# Patient Record
Sex: Female | Born: 1950 | Race: White | Hispanic: No | Marital: Married
Health system: Southern US, Community
[De-identification: ages and names within clinical notes are randomized; demographics above are authoritative.]

## PROBLEM LIST (undated history)

## (undated) DIAGNOSIS — C801 Malignant (primary) neoplasm, unspecified: Secondary | ICD-10-CM

---

## 2020-07-14 ENCOUNTER — Encounter (HOSPITAL_BASED_OUTPATIENT_CLINIC_OR_DEPARTMENT_OTHER): Payer: Self-pay | Admitting: Emergency Medicine

## 2020-07-14 ENCOUNTER — Other Ambulatory Visit: Payer: Self-pay

## 2020-07-14 ENCOUNTER — Emergency Department (HOSPITAL_BASED_OUTPATIENT_CLINIC_OR_DEPARTMENT_OTHER)
Admission: EM | Admit: 2020-07-14 | Discharge: 2020-07-14 | Disposition: A | Discharge status: Home / Self Care | Payer: Medicare HMO | Attending: Emergency Medicine | Admitting: Emergency Medicine

## 2020-07-14 ENCOUNTER — Emergency Department (HOSPITAL_BASED_OUTPATIENT_CLINIC_OR_DEPARTMENT_OTHER): Payer: Medicare HMO

## 2020-07-14 DIAGNOSIS — S43004A Unspecified dislocation of right shoulder joint, initial encounter: Secondary | ICD-10-CM | POA: Insufficient documentation

## 2020-07-14 DIAGNOSIS — S4991XA Unspecified injury of right shoulder and upper arm, initial encounter: Secondary | ICD-10-CM | POA: Diagnosis present

## 2020-07-14 DIAGNOSIS — W1830XA Fall on same level, unspecified, initial encounter: Secondary | ICD-10-CM | POA: Insufficient documentation

## 2020-07-14 DIAGNOSIS — Z853 Personal history of malignant neoplasm of breast: Secondary | ICD-10-CM | POA: Insufficient documentation

## 2020-07-14 HISTORY — DX: Malignant (primary) neoplasm, unspecified: C80.1

## 2020-07-14 MED ORDER — PROPOFOL 10 MG/ML IV BOLUS
INTRAVENOUS | Status: AC | PRN
  Administered 2020-07-14: 39.7 mg via INTRAVENOUS

## 2020-07-14 MED ORDER — FENTANYL CITRATE (PF) 100 MCG/2ML IJ SOLN
100.0000 ug | Freq: Once | INTRAMUSCULAR | Status: AC
Start: 2020-07-14 — End: 2020-07-14
  Administered 2020-07-14: 100 ug via INTRAVENOUS
  Filled 2020-07-14: qty 2

## 2020-07-14 MED ORDER — ONDANSETRON HCL 4 MG/2ML IJ SOLN
4.0000 mg | Freq: Once | INTRAMUSCULAR | Status: AC
  Administered 2020-07-14: 4 mg via INTRAVENOUS
  Filled 2020-07-14: qty 2

## 2020-07-14 MED ORDER — PROPOFOL 10 MG/ML IV BOLUS
0.5000 mg/kg | Freq: Once | INTRAVENOUS | Status: DC
  Filled 2020-07-14: qty 20

## 2020-07-14 NOTE — ED Provider Notes (Signed)
[place] EMERGENCY DEPARTMENT Provider Note   CSN: [id number] Arrival date & time: 07/14/20  1725     History Chief Complaint  Patient presents with   Shoulder Pain    Debbie Meyer is a 70 y.o. female.   Shoulder Pain Associated symptoms: no fever   Patient presents after fall earlier today.  Seen at urgent care had right anterior shoulder dislocation.  No other injury.  Not on anticoagulation.  Sent here for reduction.  Did not hit head.  Has a sling.  States the pain is severe.  States she does not have problems with surgeries in the past.  No chest pain.  No abdominal pain.  No numbness or weakness.    Past Medical History:  Diagnosis Date   Cancer [facility])    breast cancer    There are no problems to display for this patient.   History reviewed. No pertinent surgical history.   OB History   No obstetric history on file.     History reviewed. No pertinent family history.     Home Medications Prior to Admission medications   Not on File    Allergies    Hydrocodone-acetaminophen  Review of Systems   Review of Systems  Constitutional:  Negative for fever.  Cardiovascular:  Negative for chest pain.  Gastrointestinal:  Negative for abdominal pain.  Musculoskeletal:        Right shoulder pain  Neurological:  Negative for weakness.  Psychiatric/Behavioral:  Negative for confusion.    Physical Exam Updated Vital Signs BP (!) 172/80   Pulse 63   Temp 97.8 F (36.6 C)   Resp 17   Ht 5\' 2"  (1.575 m)   Wt 79.4 kg   SpO2 97%   BMI 32.01 kg/m   Physical Exam Vitals reviewed.  Constitutional:      Appearance: Normal appearance.  HENT:     Head: Atraumatic.  Eyes:     Pupils: Pupils are equal, round, and reactive to light.  Chest:     Chest wall: No tenderness.  Abdominal:     Tenderness: There is no abdominal tenderness.  Musculoskeletal:     Cervical back: Neck supple.     Comments: Tenderness to right shoulder with anterior  fullness.  Neurovascular intact in right hand.  No elbow tenderness.  Strong radial pulse.  Skin intact.  No other extremity tenderness.  Left upper extremity restricted due to previous breast cancer  Skin:    General: Skin is warm.  Neurological:     Mental Status: She is alert and oriented to person, place, and time.    ED Results / Procedures / Treatments   Labs (all labs ordered are listed, but only abnormal results are displayed) Labs Reviewed - No data to display  EKG None  Radiology DG Shoulder Right  Result Date: 07/14/2020 CLINICAL DATA:  Postreduction right shoulder. EXAM: RIGHT SHOULDER - 2+ VIEW COMPARISON:  07/14/2020 FINDINGS: Nonstandard positioning limits examination. The right shoulder appears to be in anatomic position consistent with successful reduction of previously noted right shoulder dislocation. No fractures are identified. Degenerative changes in the glenohumeral and acromioclavicular joints. Soft tissues are unremarkable. IMPRESSION: Anatomic position of the right shoulder postreduction. Electronically Signed   By: Lucienne Capers M.D.   On: 07/14/2020 19:26    Procedures .Ortho Injury Treatment  Date/Time: 07/14/2020 6:58 PM Performed by: Davonna Belling, MD Authorized by: Davonna Belling, MD   Consent:    Consent obtained:  Written  Consent given by:  Patient   Risks discussed:  Fracture, nerve damage, restricted joint movement, vascular damage, irreducible dislocation, recurrent dislocation and stiffness   Alternatives discussed:  No treatment and alternative treatment Universal protocol:    Procedure explained and questions answered to patient or proxy's satisfaction: yes     Relevant documents present and verified: yes     Required blood products, implants, devices, and special equipment available: yes     Immediately prior to procedure a time out was called: yes     Patient identity confirmed:  Verbally with patient and arm bandInjury location:  shoulder Location details: right shoulder Injury type: dislocation Dislocation type: anterior Hill-Sachs deformity: no Chronicity: new Pre-procedure neurovascular assessment: neurovascularly intact Pre-procedure distal perfusion: normal Pre-procedure neurological function: normal Pre-procedure range of motion: reduced  Anesthesia: Local anesthesia used: no  Patient sedated: Yes. Refer to sedation procedure documentation for details of sedation. Manipulation performed: yes Reduction method: traction and counter traction Reduction successful: yes X-ray confirmed reduction: yes Immobilization: sling Post-procedure neurovascular assessment: post-procedure neurovascularly intact Post-procedure distal perfusion: normal Post-procedure neurological function: normal Post-procedure range of motion: improved   .Sedation  Date/Time: 07/14/2020 7:00 PM Performed by: Davonna Belling, MD Authorized by: Davonna Belling, MD   Consent:    Consent obtained:  Written   Consent given by:  Patient   Risks discussed:  Allergic reaction, dysrhythmia, nausea, prolonged hypoxia resulting in organ damage, respiratory compromise necessitating ventilatory assistance and intubation, vomiting and inadequate sedation   Alternatives discussed:  Analgesia without sedation Universal protocol:    Procedure explained and questions answered to patient or proxy's satisfaction: yes     Relevant documents present and verified: yes     Immediately prior to procedure, a time out was called: yes     Patient identity confirmed:  Arm band and verbally with patient Indications:    Procedure performed:  Dislocation reduction   Procedure necessitating sedation performed by:  Physician performing sedation Pre-sedation assessment:    Time since last food or drink:  6 hrs   ASA classification: class 2 - patient with mild systemic disease     Mouth opening:  3 or more finger widths   Thyromental distance:  3 finger  widths   Mallampati score:  II - soft palate, uvula, fauces visible   Neck mobility: normal     Pre-sedation assessments completed and reviewed: airway patency, cardiovascular function, hydration status, mental status, pain level and respiratory function     Pre-sedation assessments completed and reviewed: pre-procedure nausea and vomiting status not reviewed   Immediate pre-procedure details:    Reassessment: Patient reassessed immediately prior to procedure     Reviewed: vital signs     Verified: bag valve mask available, emergency equipment available, intubation equipment available, IV patency confirmed, oxygen available and suction available   Procedure details (see MAR for exact dosages):    Preoxygenation:  Nasal cannula   Sedation:  Propofol   Intended level of sedation: deep   Analgesia:  Fentanyl   Intra-procedure monitoring:  Blood pressure monitoring, continuous capnometry, frequent LOC assessments, cardiac monitor and continuous pulse oximetry   Intra-procedure events: none     Total Provider sedation time (minutes):  10 Post-procedure details:    Attendance: Constant attendance by certified staff until patient recovered     Recovery: Patient returned to pre-procedure baseline     Post-sedation assessments completed and reviewed: airway patency, cardiovascular function, hydration status, mental status, nausea/vomiting, pain level, respiratory function and temperature  Patient is stable for discharge or admission: yes     Procedure completion:  Tolerated well, no immediate complications   Medications Ordered in ED Medications  propofol (DIPRIVAN) 10 mg/mL bolus/IV push 39.7 mg (has no administration in time range)  fentaNYL (SUBLIMAZE) injection 100 mcg (100 mcg Intravenous Given 07/14/20 1817)  propofol (DIPRIVAN) 10 mg/mL bolus/IV push (39.7 mg Intravenous Given 07/14/20 1901)  ondansetron (ZOFRAN) injection 4 mg (4 mg Intravenous Given by Other 07/14/20 1931)    ED Course   I have reviewed the triage vital signs and the nursing notes.  Pertinent labs & imaging results that were available during my care of the patient were reviewed by me and considered in my medical decision making (see chart for details).    MDM Rules/Calculators/A&P                          Patient with shoulder dislocation.  Sent in from urgent care.  Reviewed x-ray report from prehospital x-ray.  Reduced under sedation.  X-ray confirmed reduction.  Feels better after the procedure.  Discharge home.  Outpatient follow-up with orthopedic surgery.  Information given for our orthopedic surgeon but has seen a different orthopedic surgeon in the past Final Clinical Impression(s) / ED Diagnoses Final diagnoses:  Shoulder dislocation, right, initial encounter    Rx / DC Orders ED Discharge Orders     None        Davonna Belling, MD 07/14/20 2350

## 2020-07-14 NOTE — ED Triage Notes (Signed)
Patient reports right shoulder dislocation. States seen at urgent care on [address] for right shoulder and sent for shoulder reduction.

## 2020-07-14 NOTE — ED Notes (Signed)
Spoke with pt about IV placement. 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 Looked for IV on right hand which is side that pt had dislocation on. Pt and husband agree to have IV placed on left side out of way of affected shoulder. Pt tolerated IV placement well. No BP placed on left side. Placed IV on thigh at this time. Pt tolerated well.

## 2020-07-15 NOTE — Sedation Documentation (Signed)
Pt sedated, unable to rate pain at this time

## 2020-07-19 ENCOUNTER — Other Ambulatory Visit: Payer: Self-pay

## 2020-07-19 ENCOUNTER — Encounter: Payer: Self-pay | Admitting: Orthopaedic Surgery

## 2020-07-19 ENCOUNTER — Ambulatory Visit (INDEPENDENT_AMBULATORY_CARE_PROVIDER_SITE_OTHER): Payer: Medicare HMO | Admitting: Orthopaedic Surgery

## 2020-07-19 DIAGNOSIS — S43014A Anterior dislocation of right humerus, initial encounter: Secondary | ICD-10-CM | POA: Diagnosis not present

## 2020-07-19 NOTE — Progress Notes (Signed)
   Office Visit Note   Patient: Debbie Meyer           Date of Birth: [birth year]           MRN: [id number] Visit Date: 07/19/2020              Requested by: Kathe Becton, DO [place],  [place] [address] PCP: Kathe Becton, DO   Assessment & Plan: Visit Diagnoses:  1. Anterior dislocation of right shoulder, initial encounter     Plan: Impression is 5 days status post right anterior shoulder dislocation that was reduced soon after.  The patient is tolerating the sling and will continue to wear this for a little over 2 more weeks until she is 3 weeks out from injury.  We have discussed the possibility of rotator cuff injury based on her age and mechanism of injury.  She will follow-up with [place] in 2 weeks time when she is 3 weeks out from injury where we will likely initiate physical therapy.  Call with concerns or questions in the meantime.  Follow-Up Instructions: Return in about 2 weeks (around 08/02/2020).   Orders:  No orders of the defined types were placed in this encounter.  No orders of the defined types were placed in this encounter.     Procedures: No procedures performed   Clinical Data: No additional findings.   Subjective: Chief Complaint  Patient presents with   Right Shoulder - Pain    HPI patient is a pleasant 70 year old female who comes in today 5 days out right shoulder anterior dislocation from a mechanical fall which occurred on 07/14/2020.  She was seen at [facility] where her shoulder was reduced.  She has been in a sling since.  She is not taking anything for the pain.  Review of Systems as detailed in HPI.  All others reviewed and are negative.   Objective: Vital Signs: There were no vitals taken for this visit.  Physical Exam well-developed well-nourished female no acute distress.  Alert and oriented x3.  Ortho Exam right shoulder exam shows full sensation distally.    Specialty Comments:  No specialty comments  available.  Imaging: No new imaging   PMFS History: There are no problems to display for this patient.  Past Medical History:  Diagnosis Date   Cancer [facility])    breast cancer    History reviewed. No pertinent family history.  History reviewed. No pertinent surgical history. Social History   Occupational History   Not on file  Tobacco Use   Smoking status: Not on file   Smokeless tobacco: Not on file  Substance and Sexual Activity   Alcohol use: Not on file   Drug use: Not on file   Sexual activity: Not on file

## 2020-08-02 ENCOUNTER — Other Ambulatory Visit: Payer: Self-pay

## 2020-08-02 ENCOUNTER — Ambulatory Visit (INDEPENDENT_AMBULATORY_CARE_PROVIDER_SITE_OTHER): Payer: Medicare HMO | Admitting: Orthopaedic Surgery

## 2020-08-02 ENCOUNTER — Encounter: Payer: Self-pay | Admitting: Orthopaedic Surgery

## 2020-08-02 VITALS — Ht 62.0 in | Wt 175.0 lb

## 2020-08-02 DIAGNOSIS — S43014A Anterior dislocation of right humerus, initial encounter: Secondary | ICD-10-CM | POA: Diagnosis not present

## 2020-08-02 NOTE — Progress Notes (Addendum)
     Patient: Debbie Meyer           Date of Birth: [birth year]           MRN: [id number] Visit Date: 08/02/2020 PCP: Kathe Becton, DO   Assessment & Plan:  Chief Complaint:  Chief Complaint  Patient presents with   Right Shoulder - Follow-up    3week s/p anterior shoulder dislocation   Visit Diagnoses:  1. Anterior dislocation of right shoulder, initial encounter     Plan: [name] returns today nearly 3 weeks status post right shoulder dislocation.  She does feel slow improvement.  Not requiring any pain medications.  She has been wearing the sling as prescribed.  Examination of the right shoulder shows active abduction to 40 degrees and active external rotation to 15degrees before discomfort.  There is no shrugging of the shoulder with abduction.  Overall her exam is encouraging fully she can rehab from this injury.  She understands that there is a significant likelihood of at least a partial rotator cuff tear but given her age we will attempt nonoperative management.  She will begin pendulum exercises this week 4 times a day and 10 cycles each time.  Continue with the sling in public.  I may wean the sling off at home.  Recheck in 3 weeks.  Follow-Up Instructions: Return in about 3 weeks (around 08/23/2020).   Orders:  No orders of the defined types were placed in this encounter.  No orders of the defined types were placed in this encounter.   Imaging: No results found.  PMFS History: There are no problems to display for this patient.  Past Medical History:  Diagnosis Date   Cancer [facility])    breast cancer    No family history on file.  History reviewed. No pertinent surgical history. Social History   Occupational History   Not on file  Tobacco Use   Smoking status: Not on file   Smokeless tobacco: Not on file  Substance and Sexual Activity   Alcohol use: Not on file   Drug use: Not on file   Sexual activity: Not on file

## 2020-08-30 ENCOUNTER — Other Ambulatory Visit: Payer: Self-pay

## 2020-08-30 ENCOUNTER — Ambulatory Visit: Payer: Medicare HMO | Admitting: Orthopaedic Surgery

## 2020-08-30 VITALS — Ht 62.0 in | Wt 175.0 lb

## 2020-08-30 DIAGNOSIS — S43014A Anterior dislocation of right humerus, initial encounter: Secondary | ICD-10-CM

## 2020-08-30 DIAGNOSIS — M25512 Pain in left shoulder: Secondary | ICD-10-CM

## 2020-08-30 DIAGNOSIS — G8929 Other chronic pain: Secondary | ICD-10-CM

## 2020-08-30 NOTE — Progress Notes (Signed)
Office Visit Note   Patient: Debbie Meyer           Date of Birth: December 24, 1950           MRN: [id number] Visit Date: 08/30/2020              Requested by: Kathe Becton, DO [place],  [place] [id number] PCP: Kathe Becton, DO   Assessment & Plan: Visit Diagnoses:  1. Chronic left shoulder pain   2. Anterior dislocation of right shoulder, initial encounter     Plan: Impression is 6-week status post right shoulder anterior dislocation and left shoulder contusion and pain from overuse.  In regards to the right shoulder, she is doing well.  I would like to start her in physical therapy to work on range of motion and strengthening exercises.  In regards to the left shoulder, I think most of her pain is coming from a contusion and probably overuse.  We discussed x-rays today, but she would like to hold off for now.  We will incorporate physical therapy to the left shoulder as well.  She will follow-up with [place] for recheck in 6 weeks.  Call with concerns or questions in the meantime.  Follow-Up Instructions: Return in about 6 weeks (around 10/11/2020).   Orders:  No orders of the defined types were placed in this encounter.  No orders of the defined types were placed in this encounter.     Procedures: No procedures performed   Clinical Data: No additional findings.   Subjective: Chief Complaint  Patient presents with   Right Shoulder - Follow-up    Dislocation follow up    HPI patient is a pleasant 70 year old female who comes in today approximately 6 weeks out right shoulder anterior dislocation 07/14/2020.  She has been doing well.  She has recently discontinued the sling.  She has been compliant with pendulum swings.  Her pain has dramatically improved.  She has not taken anything for this.  She is also complaining of left shoulder pain which began after the fall.  She thinks that her left shoulder hit the doorknob.  She has had some pain through the deltoid  since.  Pain is worse with external rotation of the shoulder.  She does not take medication for this.  Review of Systems as detailed in HPI.  All others reviewed and are negative.   Objective: Vital Signs: Ht '5\' 2"'$  (1.575 m)   Wt 175 lb (79.4 kg)   BMI 32.01 kg/m   Physical Exam well-developed well-nourished female in no acute distress.  Alert and oriented x3.  Ortho Exam right shoulder exam shows forward flexion to about 150 degrees.  Abduction to about 90 degrees.  4 out of 5 strength throughout.  Left shoulder exam shows near full range of motion all planes.  She does have pain with internal rotation to T12.  Negative empty can.  Full strength throughout.  No bony tenderness.  She is neurovascular intact distally.  Specialty Comments:  No specialty comments available.  Imaging: No new imaging   PMFS History: There are no problems to display for this patient.  Past Medical History:  Diagnosis Date   Cancer [facility])    breast cancer    No family history on file.  No past surgical history on file. Social History   Occupational History   Not on file  Tobacco Use   Smoking status: Not on file   Smokeless tobacco: Not on file  Substance and Sexual Activity   Alcohol use: Not on file   Drug use: Not on file   Sexual activity: Not on file

## 2020-10-11 ENCOUNTER — Other Ambulatory Visit: Payer: Self-pay

## 2020-10-11 ENCOUNTER — Ambulatory Visit: Payer: Medicare HMO | Admitting: Orthopaedic Surgery

## 2020-10-11 ENCOUNTER — Encounter: Payer: Self-pay | Admitting: Orthopaedic Surgery

## 2020-10-11 DIAGNOSIS — M25512 Pain in left shoulder: Secondary | ICD-10-CM

## 2020-10-11 DIAGNOSIS — G8929 Other chronic pain: Secondary | ICD-10-CM | POA: Diagnosis not present

## 2020-10-11 DIAGNOSIS — S43004D Unspecified dislocation of right shoulder joint, subsequent encounter: Secondary | ICD-10-CM

## 2020-10-11 MED ORDER — DICLOFENAC SODIUM 75 MG PO TBEC: 75.0000 mg | DELAYED_RELEASE_TABLET | Freq: Two times a day (BID) | ORAL | 2 refills | Status: AC | PRN

## 2020-10-11 NOTE — Progress Notes (Signed)
Office Visit Note   Patient: Debbie Meyer           Date of Birth: [birth year]           MRN: [id number] Visit Date: 10/11/2020              Requested by: Kathe Becton, DO [place],  [place] [address] PCP: Kathe Becton, DO   Assessment & Plan: Visit Diagnoses:  1. Shoulder dislocation, right, subsequent encounter   2. Chronic left shoulder pain     Plan: Impression is 12 weeks status post right anterior shoulder dislocation and chronic left shoulder pain following initial injury 12 weeks ago.  In regards to the right shoulder, she is doing well.  In regards to the left shoulder, she is still having pain which is really not improved, however this is not significant enough to take over-the-counter medication.  We again discussed working up the left shoulder, but she has elected to try a course of anti-inflammatories for now.  If her symptoms do not improve over the next several weeks she will let us know.  Call with concerns or questions in the meantime.  Follow-Up Instructions: Return if symptoms worsen or fail to improve.   Orders:  No orders of the defined types were placed in this encounter.  Meds ordered this encounter  Medications   diclofenac (VOLTAREN) 75 MG EC tablet    Sig: Take 1 tablet (75 mg total) by mouth 2 (two) times daily as needed.    Dispense:  90 tablet    Refill:  2      Procedures: No procedures performed   Clinical Data: No additional findings.   Subjective: Chief Complaint  Patient presents with   Right Shoulder - Follow-up   Left Shoulder - Follow-up    HPI patient is a pleasant 70 year old female who comes in today approximately 12 weeks out right anterior shoulder dislocation.  During this injury she also sustained a probable contusion to the left shoulder.  She has been in physical therapy for her right shoulder and has regained near full range of motion.  She still has some pain but this has improved.  Her pain is not  bad enough to take over-the-counter pain medication.  In regards to the left shoulder, she has continued pain primarily to the deltoid.  Worse with activity.  We previously discussed obtaining x-rays of the left shoulder but she was not interested.  Review of Systems as detailed in HPI.  All others reviewed and are negative.   Objective: Vital Signs: There were no vitals taken for this visit.  Physical Exam well-developed well-nourished female no acute distress.  Alert and oriented x3.  Ortho Exam bilateral shoulder exam reveals full active range of motion in all planes.  She is neurovascular intact distally.  Minimally positive empty can on the left.  No focal weakness.    Specialty Comments:  No specialty comments available.  Imaging: No new imaging   PMFS History: There are no problems to display for this patient.  Past Medical History:  Diagnosis Date   Cancer [facility])    breast cancer    No family history on file.  History reviewed. No pertinent surgical history. Social History   Occupational History   Not on file  Tobacco Use   Smoking status: Not on file   Smokeless tobacco: Not on file  Substance and Sexual Activity   Alcohol use: Not on file   Drug  use: Not on file   Sexual activity: Not on file

## 2022-09-10 IMAGING — DX DG SHOULDER 2+V*R*
3 series · 3 of 3 positions shown · non-contrast
Comparison: 07/14/2020

CLINICAL DATA: Postreduction right shoulder.

EXAM:
RIGHT SHOULDER - 2+ VIEW

[shoulder ap (1 of 2)]
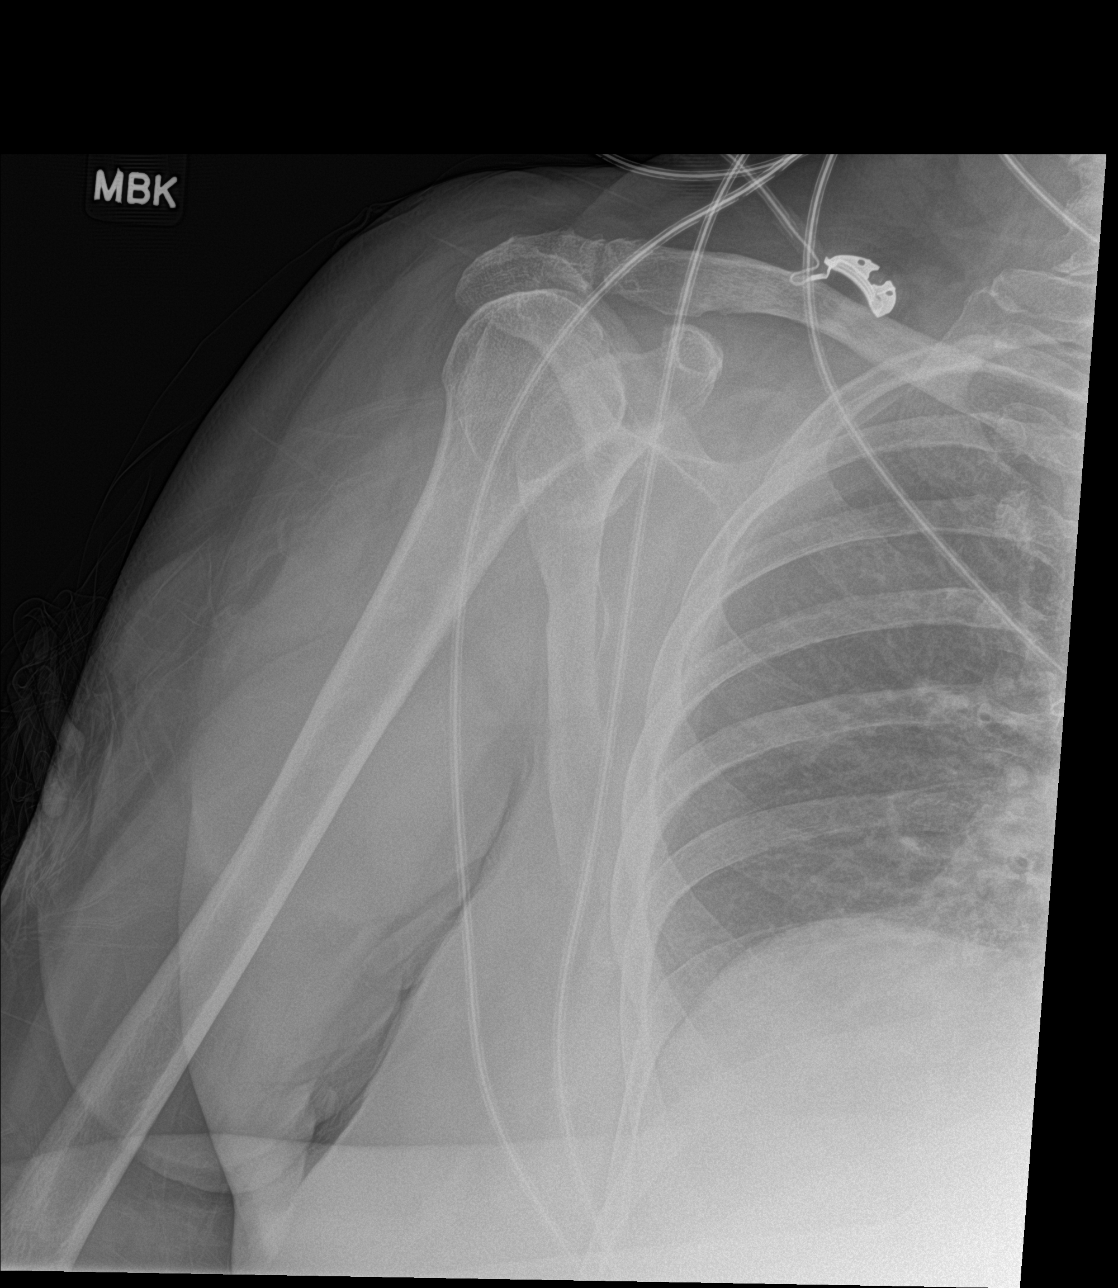

[shoulder obl]
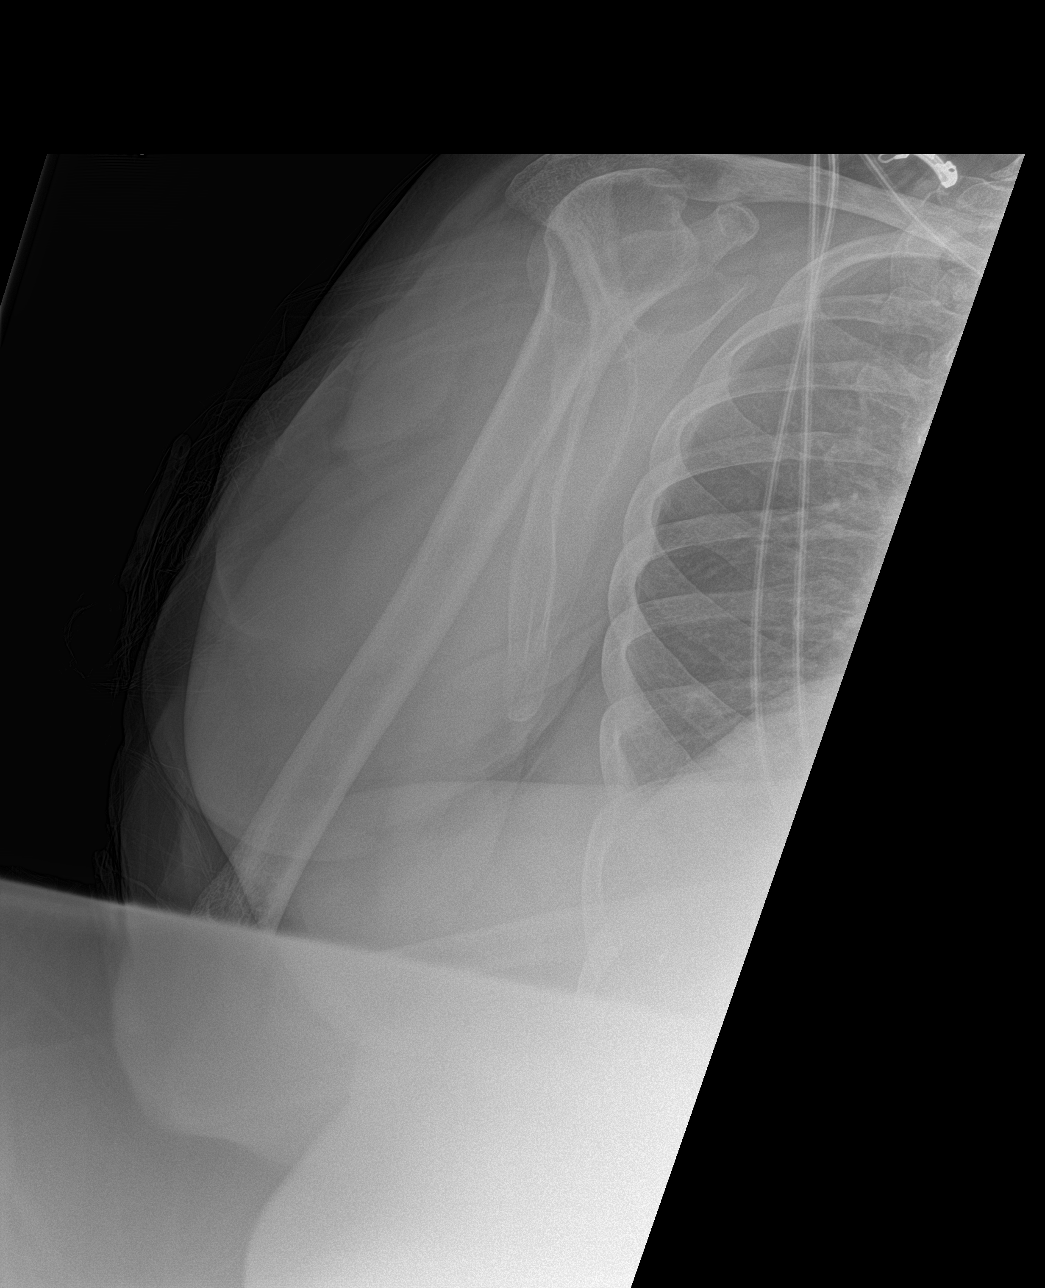

[shoulder ap (2 of 2)]
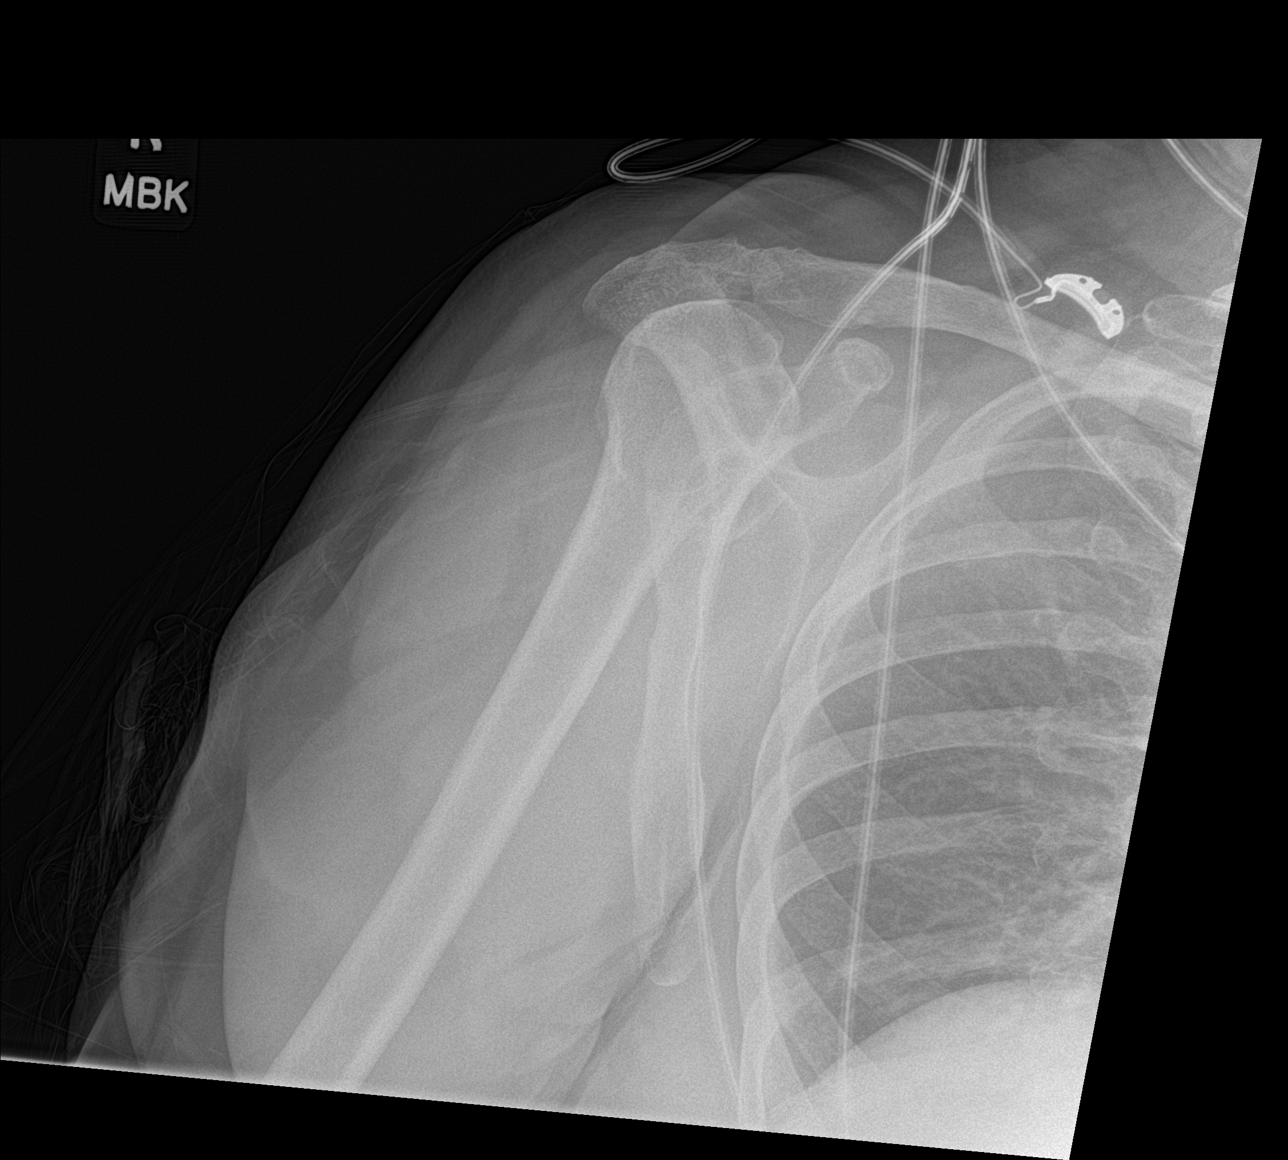

[3 of 3 positions shown; findings below may reference images not displayed]

FINDINGS: Nonstandard positioning limits examination. The right shoulder
appears to be in anatomic position consistent with successful
reduction of previously noted right shoulder dislocation. No
fractures are identified. Degenerative changes in the glenohumeral
and acromioclavicular joints. Soft tissues are unremarkable.
IMPRESSION: Anatomic position of the right shoulder postreduction.
